# Patient Record
Sex: Female | Born: 1990 | Race: White | Hispanic: No | Marital: Single | Smoking: Never smoker
Health system: Southern US, Community
[De-identification: ages and names within clinical notes are randomized; demographics above are authoritative.]

## PROBLEM LIST (undated history)

## (undated) DIAGNOSIS — K589 Irritable bowel syndrome without diarrhea: Secondary | ICD-10-CM

## (undated) DIAGNOSIS — F32A Depression, unspecified: Secondary | ICD-10-CM

## (undated) DIAGNOSIS — F419 Anxiety disorder, unspecified: Secondary | ICD-10-CM

## (undated) DIAGNOSIS — F329 Major depressive disorder, single episode, unspecified: Secondary | ICD-10-CM

## (undated) DIAGNOSIS — R251 Tremor, unspecified: Secondary | ICD-10-CM

## (undated) HISTORY — PX: DENTAL SURGERY: SHX609

---
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
## 2013-03-07 IMAGING — US ABDOMEN ULTRASOUND
1 series · 17 of 25 positions shown · non-contrast
Comparison: none

REASON FOR EXAM: complete   abd pain gen
COMMENTS:

[Series 1: abdomen ultrasound · 17 of 82 slices shown]
[im 1/82]
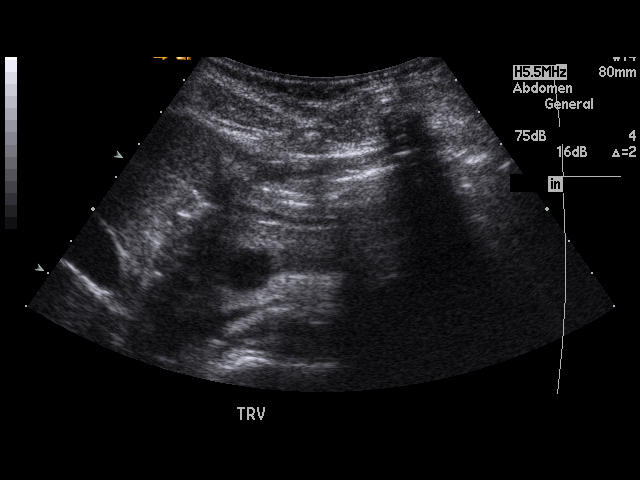
[im 7/82]
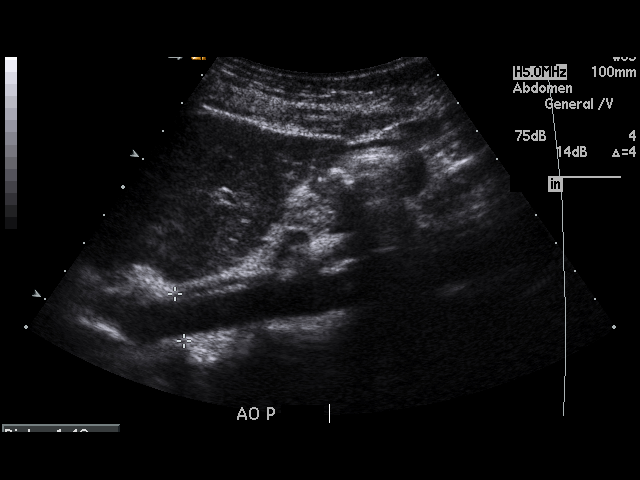
[im 11/82]
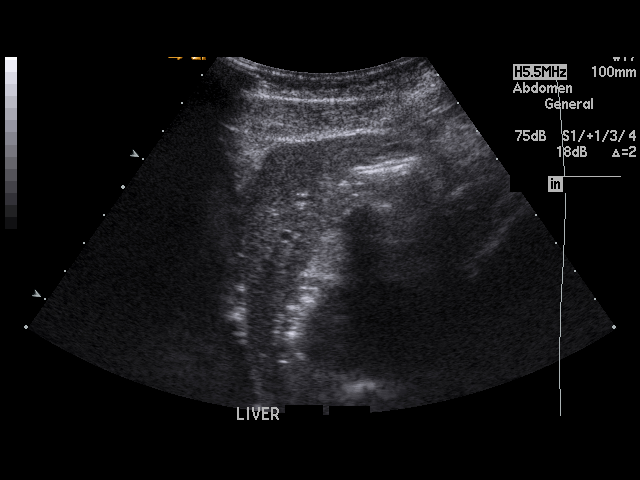
[im 17/82]
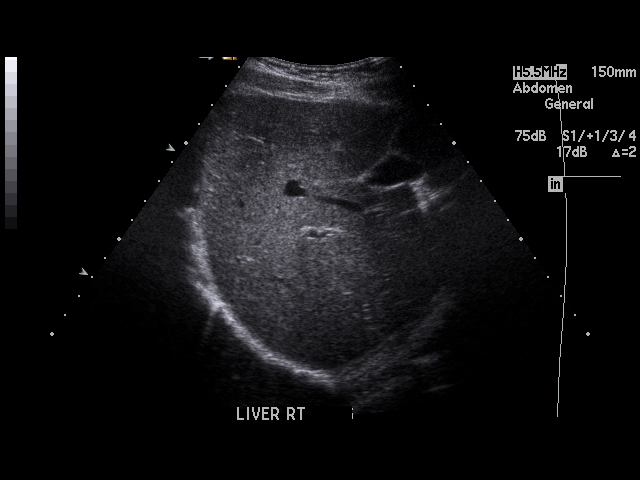
[im 21/82]
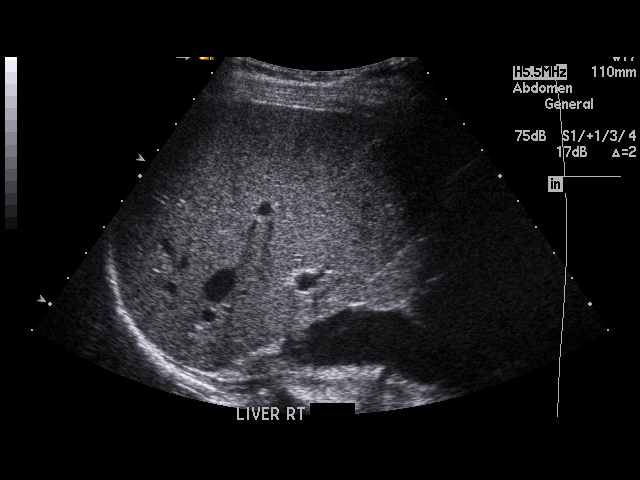
[im 28/82]
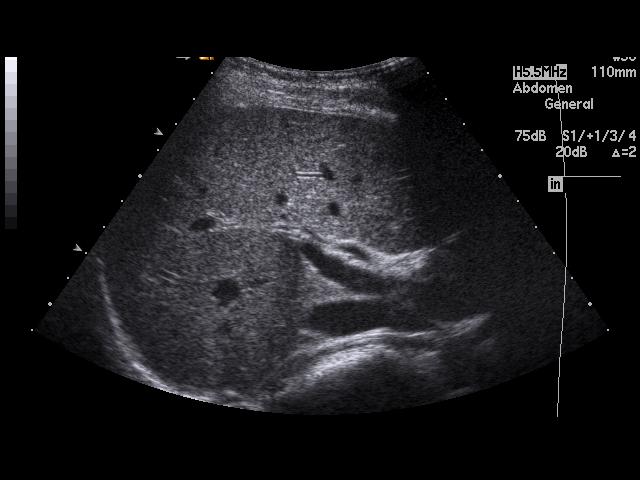
[im 31/82]
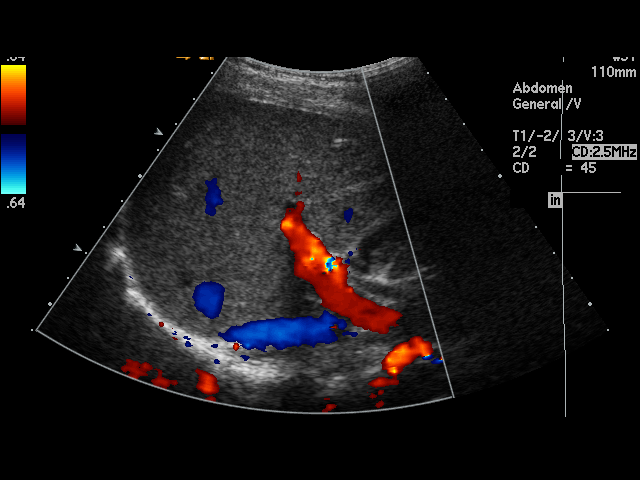
[im 38/82]
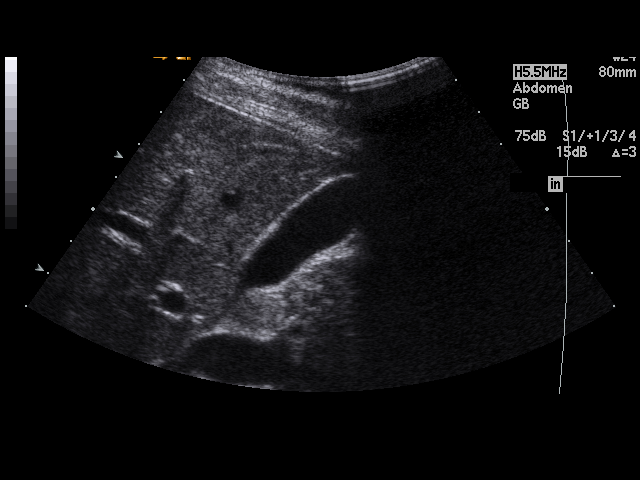
[im 41/82]
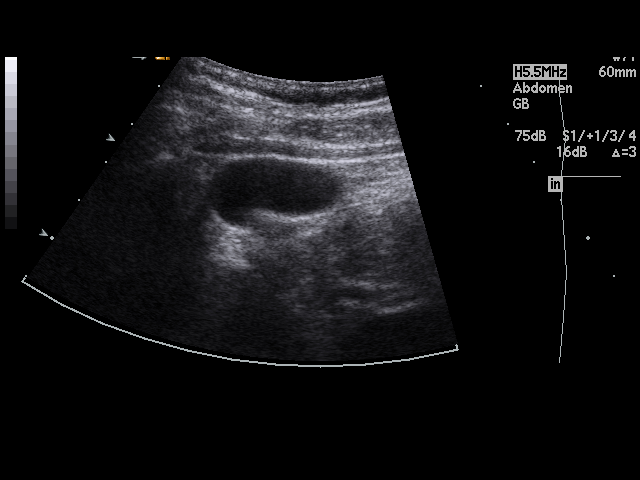
[im 44/82]
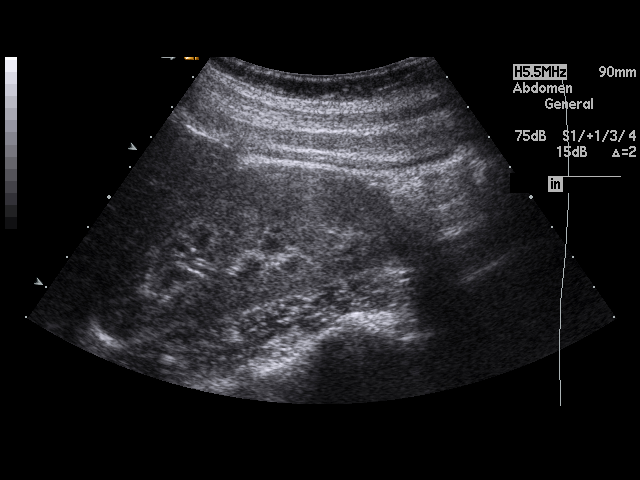
[im 51/82]
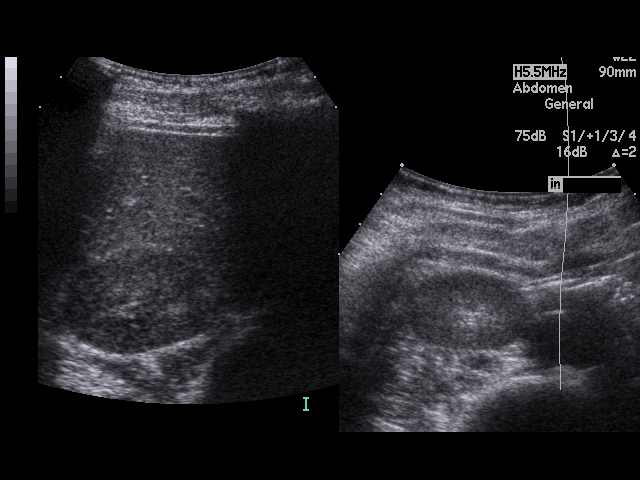
[im 55/82]
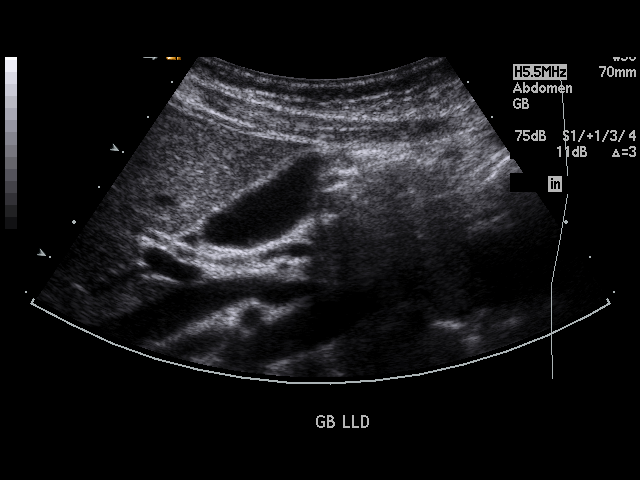
[im 61/82]
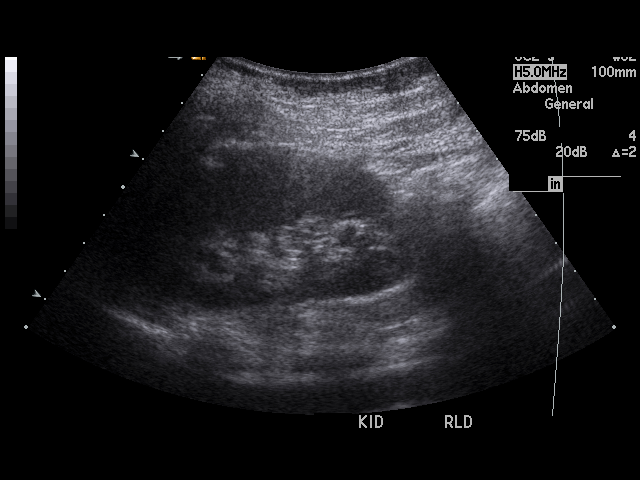
[im 65/82]
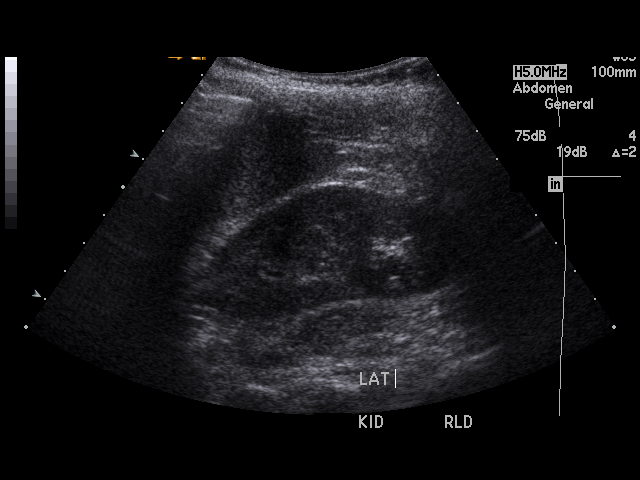
[im 71/82]
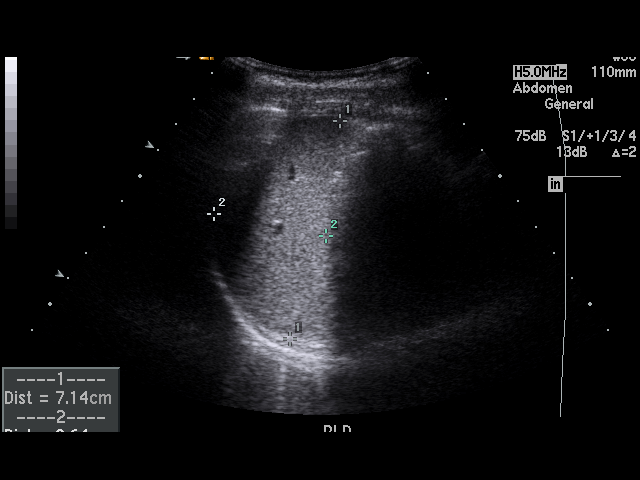
[im 75/82]
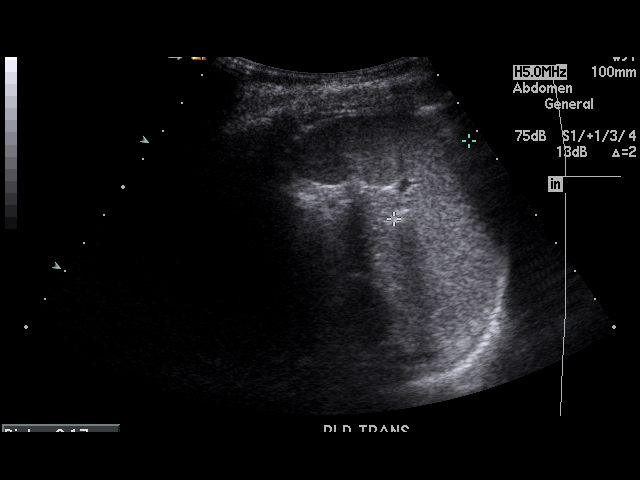
[im 82/82]
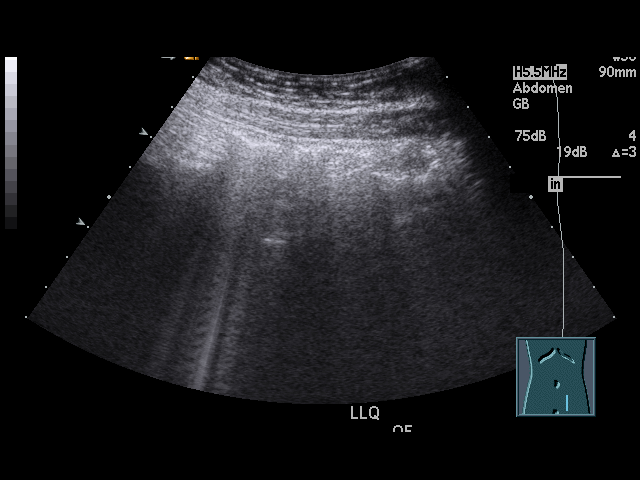

[17 of 25 positions shown; findings below may reference images not displayed]

PROCEDURE:     US  - US ABDOMEN GENERAL SURVEY  - July 06, 2011 [DATE]

RESULT:     The liver and spleen are normal in appearance. The pancreas is
not visualized adequately for evaluation on this exam. The abdominal aorta
and inferior vena cava show no significant abnormalities. No gallstones are
seen. There is no thickening of the gallbladder wall. The common bile duct
measures 1.9 mm in diameter which is within normal limits. The kidneys show
no hydronephrosis. Sagittally, the right kidney measures 8.45 cm and the
left measures 8.53 cm. No ascites is seen.
IMPRESSION: 1. No significant abnormalities are identified.
2. The pancreas is not visualized adequately for evaluation on this exam.

## 2016-02-19 ENCOUNTER — Emergency Department
Admission: EM | Admit: 2016-02-19 | Discharge: 2016-02-19 | Disposition: A | Discharge status: Home / Self Care | Payer: Self-pay | Attending: Emergency Medicine | Admitting: Emergency Medicine

## 2016-02-19 ENCOUNTER — Encounter: Payer: Self-pay | Admitting: Emergency Medicine

## 2016-02-19 DIAGNOSIS — R1032 Left lower quadrant pain: Secondary | ICD-10-CM | POA: Insufficient documentation

## 2016-02-19 DIAGNOSIS — R109 Unspecified abdominal pain: Secondary | ICD-10-CM

## 2016-02-19 LAB — COMPREHENSIVE METABOLIC PANEL
ALT: 14 U/L (ref 14–54)
ANION GAP: 8 (ref 5–15)
AST: 25 U/L (ref 15–41)
Albumin: 4.8 g/dL (ref 3.5–5.0)
Alkaline Phosphatase: 66 U/L (ref 38–126)
BUN: 8 mg/dL (ref 6–20)
CHLORIDE: 106 mmol/L (ref 101–111)
CO2: 25 mmol/L (ref 22–32)
Calcium: 9.4 mg/dL (ref 8.9–10.3)
Creatinine, Ser: 0.8 mg/dL (ref 0.44–1.00)
Glucose, Bld: 108 mg/dL — ABNORMAL HIGH (ref 65–99)
POTASSIUM: 3.7 mmol/L (ref 3.5–5.1)
Sodium: 139 mmol/L (ref 135–145)
Total Bilirubin: 0.5 mg/dL (ref 0.3–1.2)
Total Protein: 8.7 g/dL — ABNORMAL HIGH (ref 6.5–8.1)

## 2016-02-19 LAB — URINALYSIS COMPLETE WITH MICROSCOPIC (ARMC ONLY)
BILIRUBIN URINE: NEGATIVE
Glucose, UA: NEGATIVE mg/dL
Hgb urine dipstick: NEGATIVE
Leukocytes, UA: NEGATIVE
Nitrite: NEGATIVE
PH: 6 (ref 5.0–8.0)
PROTEIN: NEGATIVE mg/dL
Specific Gravity, Urine: 1.019 (ref 1.005–1.030)

## 2016-02-19 LAB — CBC
HCT: 46.6 % (ref 35.0–47.0)
Hemoglobin: 15.8 g/dL (ref 12.0–16.0)
MCH: 31.3 pg (ref 26.0–34.0)
MCHC: 33.9 g/dL (ref 32.0–36.0)
MCV: 92.2 fL (ref 80.0–100.0)
PLATELETS: 194 10*3/uL (ref 150–440)
RBC: 5.05 MIL/uL (ref 3.80–5.20)
RDW: 14 % (ref 11.5–14.5)
WBC: 7.4 10*3/uL (ref 3.6–11.0)

## 2016-02-19 LAB — LIPASE, BLOOD: LIPASE: 29 U/L (ref 11–51)

## 2016-02-19 MED ORDER — DICYCLOMINE HCL 20 MG PO TABS: 20.0000 mg | ORAL_TABLET | Freq: Three times a day (TID) | ORAL | 0 refills | Status: DC | PRN

## 2016-02-19 MED ORDER — DICYCLOMINE HCL 10 MG/ML IM SOLN
20.0000 mg | Freq: Once | INTRAMUSCULAR | Status: AC
  Administered 2016-02-19: 20 mg via INTRAMUSCULAR
  Filled 2016-02-19: qty 2

## 2016-02-19 NOTE — Discharge Instructions (Signed)
Please seek medical attention for any high fevers, chest pain, shortness of breath, change in behavior, persistent vomiting, bloody stool or any other new or concerning symptoms.  

## 2016-02-19 NOTE — ED Triage Notes (Addendum)
Pt presents to ED with c/o reoccurring mid abd pain that feels like an ache that radiates from the bottom to the top of her abd. Pt reports nausea but denies vomiting. Hx of the same and was seen by her pcp for the same.

## 2016-02-19 NOTE — ED Provider Notes (Signed)
[facility] Emergency Department Provider Note   ____________________________________________   I have reviewed the triage vital signs and the nursing notes.   HISTORY  Chief Complaint Abdominal Pain and Emesis   History limited by: Not Limited   HPI Paige Hicks is a 25 y.o. female who presents to the emergency department today because of concern for abdominal pain. It is located somewhat throughout the stomach but worse in the left lower quadrant. The patient states that the pain has been present for the past 3-4 days. She has had some associated nausea. Denies any diarrhea or change in stool. She states that she has had similar pain multiple times in the past and talked to her PCP about this years ago. She has not noticed any factors that make the pain better or worse. No fevers.   History reviewed. No pertinent past medical history.  There are no active problems to display for this patient.   Past Surgical History:  Procedure Laterality Date  . DENTAL SURGERY      Prior to Admission medications   Not on File    Allergies Review of patient's allergies indicates no known allergies.  No family history on file.  Social History Social History  Substance Use Topics  . Smoking status: Never Smoker  . Smokeless tobacco: Never Used  . Alcohol use No    Review of Systems  Constitutional: Negative for fever. Cardiovascular: Negative for chest pain. Respiratory: Negative for shortness of breath. Gastrointestinal: Positive for abdominal pain. Genitourinary: Negative for dysuria. Musculoskeletal: Negative for back pain. Skin: Negative for rash. Neurological: Negative for headaches, focal weakness or numbness.  10-point ROS otherwise negative.  ____________________________________________   PHYSICAL EXAM:  VITAL SIGNS: ED Triage Vitals  Enc Vitals Group     BP 02/19/16 0452 118/83     Pulse Rate 02/19/16 0452 (!) 102     Resp  02/19/16 0452 20     Temp 02/19/16 0452 98 F (36.7 C)     Temp Source 02/19/16 0452 Oral     SpO2 02/19/16 0452 98 %     Weight 02/19/16 0453 111 lb (50.3 kg)     Height 02/19/16 0453 5\' 2"  (1.575 m)   Constitutional: Alert and oriented. Well appearing and in no distress. Eyes: Conjunctivae are normal. Normal extraocular movements. ENT   Head: Normocephalic and atraumatic.   Nose: No congestion/rhinnorhea.   Mouth/Throat: Mucous membranes are moist.   Neck: No stridor. Hematological/Lymphatic/Immunilogical: No cervical lymphadenopathy. Cardiovascular: Normal rate, regular rhythm.  No murmurs, rubs, or gallops.  Respiratory: Normal respiratory effort without tachypnea nor retractions. Breath sounds are clear and equal bilaterally. No wheezes/rales/rhonchi. Gastrointestinal: Soft and nontender. No distention.  Genitourinary: Deferred Musculoskeletal: Normal range of motion in all extremities. No lower extremity edema. Neurologic:  Normal speech and language. No gross focal neurologic deficits are appreciated.  Skin:  Skin is warm, dry and intact. No rash noted. Psychiatric: Mood and affect are normal. Speech and behavior are normal. Patient exhibits appropriate insight and judgment.  ____________________________________________    LABS (pertinent positives/negatives)  Labs Reviewed  COMPREHENSIVE METABOLIC PANEL - Abnormal; Notable for the following:       Result Value   Glucose, Bld 108 (*)    Total Protein 8.7 (*)    All other components within normal limits  URINALYSIS COMPLETEWITH MICROSCOPIC ([facility] ONLY) - Abnormal; Notable for the following:    Color, Urine YELLOW (*)    APPearance CLEAR (*)    Ketones,  ur TRACE (*)    Bacteria, UA RARE (*)    Squamous Epithelial / LPF 0-5 (*)    All other components within normal limits  LIPASE, BLOOD  CBC      ____________________________________________   EKG  None  ____________________________________________    RADIOLOGY  None  ____________________________________________   PROCEDURES  Procedures  ____________________________________________   INITIAL IMPRESSION / ASSESSMENT AND PLAN / ED COURSE  Pertinent labs & imaging results that were available during my care of the patient were reviewed by me and considered in my medical decision making (see chart for details).  Patient with acute on chronic intermittent abdominal pain. Lab work unremarkable. Patient felt better after bentyl. Will give prescription for bentyl and have patient follow up with gi.   ____________________________________________   FINAL CLINICAL IMPRESSION(S) / ED DIAGNOSES  Final diagnoses:  Abdominal pain, unspecified abdominal location     Note: This dictation was prepared with Dragon dictation. Any transcriptional errors that result from this process are unintentional    Phineas SemenGraydon Abdulwahab Demelo, MD 02/19/16 [phone]

## 2016-08-20 ENCOUNTER — Emergency Department
Admission: EM | Admit: 2016-08-20 | Discharge: 2016-08-20 | Disposition: A | Discharge status: Home / Self Care | Payer: Self-pay | Attending: Emergency Medicine | Admitting: Emergency Medicine

## 2016-08-20 ENCOUNTER — Encounter: Payer: Self-pay | Admitting: Emergency Medicine

## 2016-08-20 DIAGNOSIS — R45851 Suicidal ideations: Secondary | ICD-10-CM

## 2016-08-20 DIAGNOSIS — F329 Major depressive disorder, single episode, unspecified: Secondary | ICD-10-CM | POA: Insufficient documentation

## 2016-08-20 DIAGNOSIS — F32A Depression, unspecified: Secondary | ICD-10-CM

## 2016-08-20 DIAGNOSIS — F840 Autistic disorder: Secondary | ICD-10-CM

## 2016-08-20 HISTORY — DX: Depression, unspecified: F32.A

## 2016-08-20 HISTORY — DX: Tremor, unspecified: R25.1

## 2016-08-20 HISTORY — DX: Major depressive disorder, single episode, unspecified: F32.9

## 2016-08-20 HISTORY — DX: Anxiety disorder, unspecified: F41.9

## 2016-08-20 HISTORY — DX: Irritable bowel syndrome without diarrhea: K58.9

## 2016-08-20 HISTORY — DX: Irritable bowel syndrome, unspecified: K58.9

## 2016-08-20 LAB — CBC
HCT: 46.8 % (ref 35.0–47.0)
Hemoglobin: 15.9 g/dL (ref 12.0–16.0)
MCH: 31.1 pg (ref 26.0–34.0)
MCHC: 34 g/dL (ref 32.0–36.0)
MCV: 91.5 fL (ref 80.0–100.0)
PLATELETS: 171 10*3/uL (ref 150–440)
RBC: 5.11 MIL/uL (ref 3.80–5.20)
RDW: 13.6 % (ref 11.5–14.5)
WBC: 8.5 10*3/uL (ref 3.6–11.0)

## 2016-08-20 LAB — ACETAMINOPHEN LEVEL: Acetaminophen (Tylenol), Serum: <10 ug/mL — ABNORMAL LOW (ref 10–30)

## 2016-08-20 LAB — COMPREHENSIVE METABOLIC PANEL
ALT: 11 U/L — ABNORMAL LOW (ref 14–54)
AST: 23 U/L (ref 15–41)
Albumin: 4.3 g/dL (ref 3.5–5.0)
Alkaline Phosphatase: 55 U/L (ref 38–126)
Anion gap: 9 (ref 5–15)
BUN: 8 mg/dL (ref 6–20)
CHLORIDE: 107 mmol/L (ref 101–111)
CO2: 22 mmol/L (ref 22–32)
Calcium: 9.3 mg/dL (ref 8.9–10.3)
Creatinine, Ser: 0.83 mg/dL (ref 0.44–1.00)
Glucose, Bld: 88 mg/dL (ref 65–99)
POTASSIUM: 3.3 mmol/L — AB (ref 3.5–5.1)
Sodium: 138 mmol/L (ref 135–145)
Total Bilirubin: 0.9 mg/dL (ref 0.3–1.2)
Total Protein: 7.7 g/dL (ref 6.5–8.1)

## 2016-08-20 LAB — ETHANOL

## 2016-08-20 LAB — SALICYLATE LEVEL

## 2016-08-20 LAB — TSH: TSH: 2.32 u[IU]/mL (ref 0.350–4.500)

## 2016-08-20 MED ORDER — FLUOXETINE HCL 20 MG PO CAPS
20.0000 mg | ORAL_CAPSULE | Freq: Every day | ORAL | 0 refills | Status: DC
Start: 2016-08-20 — End: 2018-10-01

## 2016-08-20 MED ORDER — OLANZAPINE 2.5 MG PO TABS: 1.2500 mg | ORAL_TABLET | Freq: Every day | ORAL | 0 refills | Status: DC

## 2016-08-20 NOTE — Discharge Instructions (Signed)
Follow up with your primary care doctor or psychiatry for re-evaluation of your symptoms and response to treatment with these new medications. They may want to try methyphenidate or other psychostimulant medication for possible ADD/ADHD.  They may also want to refer you to a sleep specialist due to reports of sleep disturbances. A primary care doctor can also address your abnormal periods .

## 2016-08-20 NOTE — ED Notes (Signed)
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
## 2016-08-20 NOTE — ED Provider Notes (Signed)
Psychiatry consult reviewed. Extensive recommendations. Diagnosis of autism spectrum disorder as well as depression. Started patient on low-dose olanzapine as well as Prozac. Follow-up with primary care and outpatient mental health. Has other less acute medical issues that can be managed by primary care including abnormal periods and possibly ADHD. Medically stable for discharge. Psychiatrically stable. IVC reversed by psychiatry Dr. Maricela Bo.   Sharman Cheek, MD 08/20/16 [phone]

## 2016-08-20 NOTE — ED Notes (Signed)
BEHAVIORAL HEALTH ROUNDING Patient sleeping: No. Patient alert and oriented: yes Behavior appropriate: Yes.  ; If no, describe:  Nutrition and fluids offered: yes Toileting and hygiene offered: Yes  Sitter present: q15 minute observations and security  monitoring Law enforcement present: Yes  ODS  

## 2016-08-20 NOTE — ED Provider Notes (Signed)
[facility] Emergency Department Provider Note   ____________________________________________   First MD Initiated Contact with Patient 08/20/16 1348     (approximate)  I have reviewed the triage vital signs and the nursing notes.   HISTORY  Chief Complaint Suicidal   HPI Paige Hicks is a 26 y.o. female with a history of IBS as well as anxiety and depression who is presenting to the emergency department today with suicidal ideation. She does not have a plan but says that she has been having all rough suicidal thoughts over the past years. Denies any previous suicide attempts.   Past Medical History:  Diagnosis Date  . Anxiety   . Depression   . IBS (irritable bowel syndrome)   . Occasional tremors    "since birth"    There are no active problems to display for this patient.   Past Surgical History:  Procedure Laterality Date  . DENTAL SURGERY    . DENTAL SURGERY      Prior to Admission medications   Medication Sig Start Date End Date Taking? Authorizing Provider  dicyclomine (BENTYL) 20 MG tablet Take 1 tablet (20 mg total) by mouth 3 (three) times daily as needed for spasms (abdominal pain). Patient not taking: Reported on 08/20/2016 02/19/16 02/18/17  Phineas Semen, MD    Allergies Coconut fatty acids  History reviewed. No pertinent family history.  Social History Social History  Substance Use Topics  . Smoking status: Never Smoker  . Smokeless tobacco: Never Used  . Alcohol use No    Review of Systems  Constitutional: No fever/chills Eyes: No visual changes. ENT: No sore throat. Cardiovascular: Denies chest pain. Respiratory: Denies shortness of breath. Gastrointestinal: No abdominal pain.  No nausea, no vomiting.  No diarrhea.  No constipation. Genitourinary: Negative for dysuria. Musculoskeletal: Negative for back pain. Skin: Negative for rash. Neurological: Negative for headaches, focal weakness or  numbness.   ____________________________________________   PHYSICAL EXAM:  VITAL SIGNS: ED Triage Vitals [08/20/16 1326]  Enc Vitals Group     BP 133/78     Pulse Rate (!) 132     Resp 18     Temp 99.2 F (37.3 C)     Temp Source Oral     SpO2 98 %     Weight 108 lb (49 kg)     Height  (1.575 m)     Head Circumference      Peak Flow      Pain Score      Pain Loc      Pain Edu?      Excl. in GC?     Constitutional: Alert and oriented. Well appearing and in no acute distress. Eyes: Conjunctivae are normal. PERRL. EOMI. Head: Atraumatic. Nose: No congestion/rhinnorhea. Mouth/Throat: Mucous membranes are moist.  Oropharynx non-erythematous. Neck: No stridor.   Cardiovascular: Tachycardic but respirophasic. Grossly normal heart sounds.   Respiratory: Normal respiratory effort.  No retractions. Lungs CTAB. Gastrointestinal: Soft and nontender. No distention.  Musculoskeletal: No lower extremity tenderness nor edema.  No joint effusions. Neurologic:  Normal speech and language. No gross focal neurologic deficits are appreciated. No gait instability. Skin:  Skin is warm, dry and intact. No rash noted. Psychiatric: Mood and affect are normal. Speech and behavior are normal.  ____________________________________________   LABS (all labs ordered are listed, but only abnormal results are displayed)  Labs Reviewed  COMPREHENSIVE METABOLIC PANEL - Abnormal; Notable for the following:  Result Value   Potassium 3.3 (*)    ALT 11 (*)    All other components within normal limits  ACETAMINOPHEN LEVEL - Abnormal; Notable for the following:    Acetaminophen (Tylenol), Serum <10 (*)    All other components within normal limits  ETHANOL  SALICYLATE LEVEL  CBC  URINE DRUG SCREEN, QUALITATIVE ([facility] ONLY)  POC URINE PREG, ED    ____________________________________________  EKG   ____________________________________________  RADIOLOGY   ____________________________________________   PROCEDURES  Procedure(s) performed:   Procedures  Critical Care performed:   ____________________________________________   INITIAL IMPRESSION / ASSESSMENT AND PLAN / ED COURSE  Pertinent labs & imaging results that were available during my care of the patient were reviewed by me and considered in my medical decision making (see chart for details).  ----------------------------------------- 2:44 PM on 08/20/2016 -----------------------------------------  Patient says that her heart rate races when she is anxious. It is also highly respirophasic. Patient will be involuntarily committed and assessed by the specialist on call. She is understanding of this plan and cooperative at this time.      ____________________________________________   FINAL CLINICAL IMPRESSION(S) / ED DIAGNOSES  Depression. Suicidal ideation.    NEW MEDICATIONS STARTED DURING THIS VISIT:  New Prescriptions   No medications on file     Note:  This document was prepared using Dragon voice recognition software and may include unintentional dictation errors.    Myrna Blazer, MD 08/20/16 [phone]
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
## 2016-08-20 NOTE — BH Assessment (Signed)
Discussed patient with ER MD (Dr. Scotty Court) and patient is able to discharge home when medically cleared. Patient was giving referral information and instructions on how to follow up with Outpatient Treatment ([facility] and [facility]) and [facility].

## 2018-10-01 ENCOUNTER — Encounter: Payer: Self-pay | Admitting: Obstetrics and Gynecology

## 2018-10-01 ENCOUNTER — Ambulatory Visit (INDEPENDENT_AMBULATORY_CARE_PROVIDER_SITE_OTHER): Payer: Self-pay | Admitting: Obstetrics and Gynecology

## 2018-10-01 ENCOUNTER — Other Ambulatory Visit: Payer: Self-pay

## 2018-10-01 VITALS — BP 114/74 | Ht 62.0 in | Wt 115.2 lb

## 2018-10-01 DIAGNOSIS — R102 Pelvic and perineal pain: Secondary | ICD-10-CM

## 2018-10-01 DIAGNOSIS — N921 Excessive and frequent menstruation with irregular cycle: Secondary | ICD-10-CM | POA: Insufficient documentation

## 2018-10-01 LAB — POCT HEMOGLOBIN: Hemoglobin: 14.7 g/dL — AB (ref 11–14.6)

## 2018-10-01 NOTE — Progress Notes (Signed)
Patient, No Pcp Per   Chief Complaint  Patient presents with  . Menorrhagia    [facility] and since then heavy cycles and entire pelvic area pain and some lower back pain, pt states she bleeds everyday    HPI:      Ms. Paige Hicks is a 28 y.o. No obstetric history on file. who LMP was Patient's last menstrual period was 09/23/2018 (exact date)., presents today for NP eval of heavy irregular bleeding. Pt started OCPs about a yr ago for cycle control and dysmen. Has been on 3 different kinds since without sx control, most recently sprintec. No late/missed pills. Bleeds almost daily, no improvement with dysmen/LBP. Uses NSAIDs/heating pad without relief. Menses prior to OCPs were Q1 to 4 months, lasting 5-10 days, heavy flow and significant dysmen. Pt took so much ibup she can't take it anymore for GI reasons. Pt's PCP concerned about anemia so had her start Fe supp but they make pt feel sick (no labs done).   She was sex active about 7 yrs ago. No dyspareunia at that time but hasn't been active since. She had a recent pap/annual and had pelvic pain with exam. No hx of abn paps/STDs. Had gyn u/s many yrs ago but none recently.  No known FH endometriosis but sister also has bad cramps.  Pt with hx of IBS constipation but can tell difference in pelvic pain/cramping. No vag sx, urin sx. No FH breast/ovar ca.    Past Medical History:  Diagnosis Date  . Anxiety   . Depression   . IBS (irritable bowel syndrome)   . Occasional tremors    "since birth"    Past Surgical History:  Procedure Laterality Date  . DENTAL SURGERY    . DENTAL SURGERY      History reviewed. No pertinent family history.  Social History   Socioeconomic History  . Marital status: Single    Spouse name: Not on file  . Number of children: Not on file  . Years of education: Not on file  . Highest education level: Not on file  Occupational History  . Not on file  Social Needs  . Financial  resource strain: Not on file  . Food insecurity:    Worry: Not on file    Inability: Not on file  . Transportation needs:    Medical: Not on file    Non-medical: Not on file  Tobacco Use  . Smoking status: Never Smoker  . Smokeless tobacco: Never Used  Substance and Sexual Activity  . Alcohol use: No  . Drug use: No  . Sexual activity: Not Currently    Birth control/protection: Pill  Lifestyle  . Physical activity:    Days per week: Not on file    Minutes per session: Not on file  . Stress: Not on file  Relationships  . Social connections:    Talks on phone: Not on file    Gets together: Not on file    Attends religious service: Not on file    Active member of club or organization: Not on file    Attends meetings of clubs or organizations: Not on file    Relationship status: Not on file  . Intimate partner violence:    Fear of current or ex partner: Not on file    Emotionally abused: Not on file    Physically abused: Not on file    Forced sexual activity: Not on file  Other  Topics Concern  . Not on file  Social History Narrative  . Not on file    Outpatient Medications Prior to Visit  Medication Sig Dispense Refill  . albuterol (VENTOLIN HFA) 108 (90 Base) MCG/ACT inhaler INHALE 1 PUFF BY MOUTH EVERY 8 HOURS AS NEEDED FOR SHORTNESS OF BREATH AND OR WHEEZING. DO NOT USE DAILY    . ondansetron (ZOFRAN) 4 MG tablet Take 4 mg by mouth every 8 (eight) hours as needed. for nausea    . SPRINTEC 28 0.25-35 MG-MCG tablet TAKE 1 TABLET BY MOUTH ONCE DAILY SAME TIME DAILY. DO NOT SKIP PILLS    . dicyclomine (BENTYL) 20 MG tablet Take 1 tablet (20 mg total) by mouth 3 (three) times daily as needed for spasms (abdominal pain). (Patient not taking: Reported on 08/20/2016) 30 tablet 0  . FLUoxetine (PROZAC) 20 MG capsule Take 1 capsule (20 mg total) by mouth daily. Take daily at 8:00 pm 30 capsule 0  . OLANZapine (ZYPREXA) 2.5 MG tablet Take 0.5 tablets (1.25 mg total) by mouth at  bedtime. Take daily at 8:00 pm 30 tablet 0   No facility-administered medications prior to visit.       ROS:  Review of Systems  Constitutional: Positive for fatigue. Negative for fever and unexpected weight change.  Respiratory: Positive for shortness of breath. Negative for cough and wheezing.   Cardiovascular: Positive for chest pain. Negative for palpitations and leg swelling.  Gastrointestinal: Negative for blood in stool, constipation, diarrhea, nausea and vomiting.  Endocrine: Negative for cold intolerance, heat intolerance and polyuria.  Genitourinary: Positive for menstrual problem, pelvic pain and vaginal bleeding. Negative for dyspareunia, dysuria, flank pain, frequency, genital sores, hematuria, urgency, vaginal discharge and vaginal pain.  Musculoskeletal: Negative for back pain, joint swelling and myalgias.  Skin: Negative for rash.  Neurological: Positive for dizziness and tremors. Negative for syncope, light-headedness, numbness and headaches.  Hematological: Negative for adenopathy.  Psychiatric/Behavioral: Positive for agitation and dysphoric mood. Negative for confusion, sleep disturbance and suicidal ideas. The patient is not nervous/anxious.     OBJECTIVE:   Vitals:  BP 114/74   Ht 5\' 2"  (1.575 m)   Wt 115 lb 3.2 oz (52.3 kg)   LMP 09/23/2018 (Exact Date)   BMI 21.07 kg/m   Physical Exam Vitals signs reviewed.  Constitutional:      Appearance: She is well-developed.  Neck:     Musculoskeletal: Normal range of motion.  Pulmonary:     Effort: Pulmonary effort is normal.  Abdominal:     Palpations: Abdomen is soft. There is no pulsatile mass.     Tenderness: There is abdominal tenderness in the right lower quadrant. There is no guarding or rebound.  Genitourinary:    General: Normal vulva.     Pubic Area: No rash.      Labia:        Right: No rash, tenderness or lesion.        Left: No rash, tenderness or lesion.      Vagina: Normal. No vaginal  discharge, erythema or tenderness.     Cervix: Normal.     Uterus: Normal. Not enlarged and not tender.      Adnexa: Left adnexa normal.       Right: Tenderness present. No mass.         Left: No mass or tenderness.    Musculoskeletal: Normal range of motion.  Skin:    General: Skin is warm and dry.  Neurological:  General: No focal deficit present.     Mental Status: She is alert and oriented to person, place, and time.  Psychiatric:        Mood and Affect: Mood normal.        Behavior: Behavior normal.        Thought Content: Thought content normal.        Judgment: Judgment normal.    RESULTS:  Results for orders placed or performed in visit on 10/01/18 (from the past 24 hour(s))  POCT hemoglobin     Status: Abnormal   Collection Time: 10/01/18 10:53 AM  Result Value Ref Range   Hemoglobin 14.7 (A) 11 - 14.6 g/dL    Assessment/Plan: Menometrorrhagia - Normal HgB. Can do MVI with iron, with food. - Plan: POCT hemoglobin, US PELVIS TRANSVANGINAL NON-OB (TV ONLY)  Pelvic pain - Suggested GYN u/s.  - Plan: US PELVIS TRANSVANGINAL NON-OB (TV ONLY)  Breakthrough bleeding on OCPs - On 3 different OCPs, no late/missed pills.   Sx not improved with OCPs. Recommended further eval with GYN u/s. If neg, suggested depo for cycle and pain control. If no improvement with depo, discussed dx lap for further eval. Depo pros/cons discussed. Pt hesitant to want to try it since her friend had side effects.  Pt is self pay. Will discuss GYN u/s with mom and call for appt if desires. I will call her with results and can discuss mgmt options again.    Return in about 2 days (around 10/03/2018) for GYN u/s--pt may want to call back to sched, [facility] to call with results.  Luberta Grabinski B. Jera Headings, PA-C 10/01/2018 10:58 AM

## 2018-10-01 NOTE — Patient Instructions (Signed)
I value your feedback and entrusting us with your care. If you get a [facility] patient survey, I would appreciate you taking the time to let us know about your experience today. Thank you! 

## 2018-10-04 ENCOUNTER — Encounter: Payer: Self-pay | Admitting: Obstetrics and Gynecology

## 2018-10-07 ENCOUNTER — Other Ambulatory Visit: Payer: Self-pay | Admitting: Obstetrics and Gynecology

## 2018-10-07 ENCOUNTER — Telehealth: Payer: Self-pay

## 2018-10-07 MED ORDER — NAPROXEN SODIUM 550 MG PO TABS: 550.0000 mg | ORAL_TABLET | Freq: Two times a day (BID) | ORAL | 0 refills | Status: DC

## 2018-10-07 NOTE — Telephone Encounter (Signed)
Pt is not anemic and has had daily bleeding for many months. Will eval with u/s.

## 2018-10-07 NOTE — Progress Notes (Signed)
Rx anaprox for menstrual cramps.

## 2018-10-07 NOTE — Telephone Encounter (Signed)
Spoke to [place], aware of Rx sent. Mom says pt is bleeding everyday since she stopped taking the medication her PCP prescribed (which was a few days after visit here per MOM). Pt feels tired and sleeps all the time.

## 2018-10-07 NOTE — Telephone Encounter (Signed)
Rx anaprox eRxd for menstrual cramps. Can take BID. Can do u/s when period goes off but that will determine next step. Pls notify pt. Thx

## 2018-10-07 NOTE — Telephone Encounter (Signed)
Patient has been reschedule to  10/09/18

## 2018-10-07 NOTE — Telephone Encounter (Signed)
Pt's mom, Kerri Perches, calling; pt's PCP gave her med to take for 10d; pt stopped it when she started her lady time; having pain and cramps.  She is due for an u/s but doesn't want anyone down there during lady time. Is the something she can do or take or can rx be sent in for this? [phone]  Mom states pt is on bc but it's not working; pt bleeds all the time.  The medication PCP gave her was medroxyprogesterone 10mg .  Pt has u/s coming up but she is very shy and doesn't want anyone down there during lady time.  Adv mom if pt is saturating pad q50min-1hr she needs to go to the ED.  Adv will send msg to [facility] to see what she wants pt to do.

## 2018-10-13 ENCOUNTER — Encounter: Payer: Self-pay | Admitting: Obstetrics and Gynecology

## 2018-10-14 ENCOUNTER — Other Ambulatory Visit: Payer: Self-pay | Admitting: Obstetrics and Gynecology

## 2018-10-14 MED ORDER — SPRINTEC 28 0.25-35 MG-MCG PO TABS: 1.0000 | ORAL_TABLET | Freq: Every day | ORAL | 0 refills | Status: AC

## 2018-10-18 ENCOUNTER — Other Ambulatory Visit: Payer: Self-pay

## 2018-10-18 ENCOUNTER — Ambulatory Visit (INDEPENDENT_AMBULATORY_CARE_PROVIDER_SITE_OTHER): Payer: Self-pay

## 2018-10-18 DIAGNOSIS — R102 Pelvic and perineal pain: Secondary | ICD-10-CM

## 2018-10-18 DIAGNOSIS — N921 Excessive and frequent menstruation with irregular cycle: Secondary | ICD-10-CM

## 2018-10-21 ENCOUNTER — Encounter: Payer: Self-pay | Admitting: Obstetrics and Gynecology

## 2018-10-22 ENCOUNTER — Encounter: Payer: Self-pay | Admitting: Obstetrics and Gynecology

## 2018-10-22 NOTE — Telephone Encounter (Signed)
Ok

## 2018-10-22 NOTE — Telephone Encounter (Signed)
Pt aware of neg GYN u/s for severe dysmen/BTB with multiple OCPs. Discussed depo but pt declines due to side effects. Also discussed IUD and pt interested. Pt is self-pay. Suggested she have IUD placed at [facility] to see if any sx elief. If no improvement, next step is [facility] pelvic pain clinic. Pt to discuss with mom and f/u with any questions.

## 2018-10-22 NOTE — Telephone Encounter (Signed)
Please see

## 2018-10-23 NOTE — Telephone Encounter (Signed)
Spoke with pt's mom, Izora Gala, per pt request, for about 20 min regarding irreg bleeding/dysmen on OCPs and neg GYN u/s. Suggested depo or IUD. Pt's mom agrees with assessment and plan but she states her daughter/pt is on the Internet too much and doesn't like the treatment suggestions. She will discuss with pt and they will f/u prn.

## 2018-10-29 ENCOUNTER — Telehealth: Payer: Self-pay

## 2018-10-29 DIAGNOSIS — R102 Pelvic and perineal pain: Secondary | ICD-10-CM

## 2018-10-29 NOTE — Telephone Encounter (Signed)
Please advise 

## 2018-10-29 NOTE — Telephone Encounter (Signed)
Pt's mom, Kerri Perches, calling for phone # or referral for pt to the pain clinic at [facility].  Send information thru MyChart please.  [phone]

## 2018-10-30 NOTE — Telephone Encounter (Signed)
Pt's mom aware.

## 2018-10-30 NOTE — Telephone Encounter (Addendum)
Order for [facility] pelvic pain clinic placed. Nancy to sched and f/u with pt. RN to let pt's mom know we are sending ref. She can't sched directly. [facility] will review our notes and contact pt for appt.

## 2018-10-31 NOTE — Telephone Encounter (Signed)
Referral submitted into [facility], and notes faxed. [facility] to contact the patient directly to schedule an appointment.

## 2018-11-11 ENCOUNTER — Telehealth: Payer: Self-pay

## 2018-11-11 NOTE — Telephone Encounter (Signed)
Has she tried the naproxen twice daily? Sent in 6/20. Thx

## 2018-11-11 NOTE — Telephone Encounter (Signed)
Pt mom called and stated she had not heard any from pain clinic for her daughter. I gave her the name of the clinic, and she will call and see if they have received the referral

## 2018-11-11 NOTE — Telephone Encounter (Signed)
Pt wants to try pain meds

## 2018-11-11 NOTE — Telephone Encounter (Signed)
Pt referred to [facility] Pelvic pain clinic. Their process is they will review the notes and contact pt for appt. We cannot schedule it from our end. It can take weeks.

## 2018-11-11 NOTE — Telephone Encounter (Signed)
Can also do heating pad. Next option is narcotics and I don't want to do that unless necessary. Elmo Putt

## 2018-11-11 NOTE — Telephone Encounter (Signed)
Pt mom needs to know the name of the place her daughter is being referred to so she can contact them. Please advise

## 2018-11-11 NOTE — Telephone Encounter (Signed)
Pt mom aware, wants to know is there anything you can send in [place] for her pain?

## 2018-11-12 ENCOUNTER — Other Ambulatory Visit: Payer: Self-pay | Admitting: Obstetrics and Gynecology

## 2018-11-12 MED ORDER — MEFENAMIC ACID 250 MG PO CAPS: ORAL_CAPSULE | ORAL | 0 refills | Status: AC

## 2018-11-12 NOTE — Telephone Encounter (Signed)
Rx ponstel eRxd. Pt to try it. Not narcotic.

## 2018-11-12 NOTE — Progress Notes (Signed)
Rx ponstel for dysmen.

## 2018-11-15 NOTE — Telephone Encounter (Signed)
Spoke with pt re: pain control. Taking anaprox at night prn sx, not BID, because sx worse at night. Some nights she doesn't need to take meds. Anaprox helps after a few hrs. Had sent in ponstel but too expensive. I don't want to do narcotics. Discussed goodrx coupon ($46 for 30 pills).  [facility] called yesterday to sched appt and will call back again today. Pt wants to cont current anaprox regimen for now. F/u prn.

## 2018-11-18 ENCOUNTER — Other Ambulatory Visit: Payer: Self-pay

## 2018-12-19 ENCOUNTER — Other Ambulatory Visit: Payer: Self-pay | Admitting: Obstetrics and Gynecology

## 2018-12-19 ENCOUNTER — Encounter: Payer: Self-pay | Admitting: Obstetrics and Gynecology

## 2018-12-19 MED ORDER — NAPROXEN SODIUM 550 MG PO TABS: 550.0000 mg | ORAL_TABLET | Freq: Two times a day (BID) | ORAL | 0 refills | Status: AC

## 2018-12-19 NOTE — Progress Notes (Signed)
Rx RF naproxen for dysmen. Pt waiting to see pain clinic.

## 2022-12-13 ENCOUNTER — Emergency Department: Payer: Medicaid Other

## 2022-12-13 ENCOUNTER — Other Ambulatory Visit: Payer: Self-pay

## 2022-12-13 ENCOUNTER — Emergency Department
Admission: EM | Admit: 2022-12-13 | Discharge: 2022-12-13 | Disposition: A | Discharge status: Home / Self Care | Payer: Medicaid Other | Attending: Emergency Medicine | Admitting: Emergency Medicine

## 2022-12-13 ENCOUNTER — Encounter: Payer: Self-pay | Admitting: Emergency Medicine

## 2022-12-13 DIAGNOSIS — R1030 Lower abdominal pain, unspecified: Secondary | ICD-10-CM | POA: Insufficient documentation

## 2022-12-13 DIAGNOSIS — N83202 Unspecified ovarian cyst, left side: Secondary | ICD-10-CM | POA: Diagnosis not present

## 2022-12-13 LAB — COMPREHENSIVE METABOLIC PANEL
ALT: 14 U/L (ref 0–44)
AST: 23 U/L (ref 15–41)
Albumin: 4 g/dL (ref 3.5–5.0)
Alkaline Phosphatase: 63 U/L (ref 38–126)
Anion gap: 9 (ref 5–15)
BUN: 9 mg/dL (ref 6–20)
CO2: 21 mmol/L — ABNORMAL LOW (ref 22–32)
Calcium: 8.8 mg/dL — ABNORMAL LOW (ref 8.9–10.3)
Chloride: 106 mmol/L (ref 98–111)
Creatinine, Ser: 0.84 mg/dL (ref 0.44–1.00)
GFR, Estimated: >60 mL/min (ref >60)
Glucose, Bld: 97 mg/dL (ref 70–99)
Potassium: 3.3 mmol/L — ABNORMAL LOW (ref 3.5–5.1)
Sodium: 136 mmol/L (ref 135–145)
Total Bilirubin: 0.6 mg/dL (ref 0.3–1.2)
Total Protein: 7.7 g/dL (ref 6.5–8.1)

## 2022-12-13 LAB — CBC
HCT: 44.1 % (ref 36.0–46.0)
Hemoglobin: 14.4 g/dL (ref 12.0–15.0)
MCH: 29.9 pg (ref 26.0–34.0)
MCHC: 32.7 g/dL (ref 30.0–36.0)
MCV: 91.7 fL (ref 80.0–100.0)
Platelets: 178 10*3/uL (ref 150–400)
RBC: 4.81 MIL/uL (ref 3.87–5.11)
RDW: 13.2 % (ref 11.5–15.5)
WBC: 6.9 10*3/uL (ref 4.0–10.5)
nRBC: 0 % (ref 0.0–0.2)

## 2022-12-13 LAB — URINALYSIS, ROUTINE W REFLEX MICROSCOPIC
Bilirubin Urine: NEGATIVE
Glucose, UA: NEGATIVE mg/dL
Hgb urine dipstick: NEGATIVE
Ketones, ur: NEGATIVE mg/dL
Leukocytes,Ua: NEGATIVE
Nitrite: NEGATIVE
Protein, ur: NEGATIVE mg/dL
Specific Gravity, Urine: 1.012 (ref 1.005–1.030)
pH: 6 (ref 5.0–8.0)

## 2022-12-13 LAB — LIPASE, BLOOD: Lipase: 37 U/L (ref 11–51)

## 2022-12-13 LAB — POC URINE PREG, ED: Preg Test, Ur: NEGATIVE

## 2022-12-13 MED ORDER — IOHEXOL 300 MG/ML  SOLN
100.0000 mL | Freq: Once | INTRAMUSCULAR | Status: AC | PRN
  Administered 2022-12-13: 100 mL via INTRAVENOUS

## 2022-12-13 MED ORDER — ONDANSETRON HCL 4 MG/2ML IJ SOLN
4.0000 mg | Freq: Once | INTRAMUSCULAR | Status: AC
  Administered 2022-12-13: 4 mg via INTRAVENOUS
  Filled 2022-12-13: qty 2

## 2022-12-13 MED ORDER — HYDROCODONE-ACETAMINOPHEN 5-325 MG PO TABS: 1.0000 | ORAL_TABLET | ORAL | 0 refills | Status: AC | PRN

## 2022-12-13 MED ORDER — SODIUM CHLORIDE 0.9 % IV BOLUS
1000.0000 mL | Freq: Once | INTRAVENOUS | Status: AC
  Administered 2022-12-13: 1000 mL via INTRAVENOUS

## 2022-12-13 MED ORDER — MORPHINE SULFATE (PF) 4 MG/ML IV SOLN
4.0000 mg | Freq: Once | INTRAVENOUS | Status: AC
  Administered 2022-12-13: 4 mg via INTRAVENOUS
  Filled 2022-12-13: qty 1

## 2022-12-13 NOTE — ED Provider Notes (Signed)
[facility] Provider Note    Event Date/Time   First MD Initiated Contact with Patient 12/13/22 [phone]     (approximate)  History   Chief Complaint: Abdominal Pain  HPI  Paige Hicks is a 32 y.o. female with a past medical history of anxiety, depression, presents to the emergency department for lower abdominal pain.  According to the patient since this morning she has been experiencing what she describes as significant pain across the lower abdomen.  Patient denies any urinary symptoms denies any vaginal bleeding or discharge.  Patient states a history of endometriosis but had a hysterectomy performed last year and has not had any issues since.  States she has not been sexually active in many years.  No fever.  Physical Exam   Triage Vital Signs: ED Triage Vitals  Encounter Vitals Group     BP 12/13/22 0848 125/79     Systolic BP Percentile --      Diastolic BP Percentile --      Pulse Rate 12/13/22 0848 90     Resp 12/13/22 0848 18     Temp 12/13/22 0848 98.8 F (37.1 C)     Temp Source 12/13/22 0848 Oral     SpO2 12/13/22 0848 100 %     Weight 12/13/22 0852 127 lb (57.6 kg)     Height 12/13/22 0852 5\' 2"  (1.575 m)     Head Circumference --      Peak Flow --      Pain Score 12/13/22 0852 9     Pain Loc --      Pain Education --      Exclude from Growth Chart --     Most recent vital signs: Vitals:   12/13/22 0848  BP: 125/79  Pulse: 90  Resp: 18  Temp: 98.8 F (37.1 C)  SpO2: 100%    General: Awake, no distress.  CV:  Good peripheral perfusion.  Regular rate and rhythm  Resp:  Normal effort.  Equal breath sounds bilaterally.  Abd:  No distention.  Soft, moderate tenderness to palpation across the lower abdomen left and right lower quadrants.  ED Results / Procedures / Treatments   RADIOLOGY  I have reviewed interpret the CT images.  Patient appears to have a large left-sided ovarian cyst on my evaluation. Radiology confirms 3.5  x 1 point centimeter cyst the left ovary. Ultrasound shows 3.6 cm left-sided ovarian simple cyst.   MEDICATIONS ORDERED IN ED: Medications  morphine (PF) 4 MG/ML injection 4 mg (has no administration in time range)  ondansetron (ZOFRAN) injection 4 mg (has no administration in time range)  sodium chloride 0.9 % bolus 1,000 mL (has no administration in time range)     IMPRESSION / MDM / ASSESSMENT AND PLAN / ED COURSE  I reviewed the triage vital signs and the nursing notes.  Patient's presentation is most consistent with acute presentation with potential threat to life or bodily function.  Patient presents to the emergency department for lower abdominal pain since awakening this morning.  Described as moderate to severe across the lower abdomen.  Denies any urinary symptoms, does have a history of endometriosis but had a hysterectomy performed last year no issues since.  States has not been sexually active in nearly a decade no concern for STDs.  Patient does have moderate tenderness to palpation.  Differential would include UTI, pyelonephritis, enteritis, diverticulitis, ovarian cyst or appendicitis.  Patient's lab work is largely reassuring with a  normal CBC including a normal white blood cell count, reassuring chemistry including normal LFTs and a normal lipase.  Urinalysis is pending.  Given the patient's discomfort we will place an IV to help control pain nausea and IV hydrate we will obtain CT imaging of the abdomen/pelvis to rule out intra-abdominal pathology.  Patient is feeling much better after pain medication.  Patient's CBC and chemistry are reassuring, lipase is negative urinalysis is normal with no signs of infection pregnancy test negative (patient status post hysterectomy).  CT shows a possible hemorrhagic cyst.  Ultrasound shows likely simple cyst.  Cyst could very likely be the cause of the patient's discomfort.  No sign of torsion.  Will discharge with a short course of pain  medication have the patient follow-up with her OB/GYN.  Patient agreeable to plan.  FINAL CLINICAL IMPRESSION(S) / ED DIAGNOSES   Lower abdominal pain Ovarian cyst   Note:  This document was prepared using Dragon voice recognition software and may include unintentional dictation errors.   Minna Antis, MD 12/13/22 1517

## 2022-12-13 NOTE — ED Notes (Signed)
Pt resting in bed, pt updated on plan waiting for scan to result. Pt denies any needs at this time

## 2022-12-13 NOTE — ED Triage Notes (Signed)
Patient to ED via POV for lower abd pain. Started this AM. Also with nausea.
